# Patient Record
Sex: Female | Born: 1960 | Race: White | Hispanic: No | Marital: Married | Smoking: Never smoker
Health system: Southern US, Community
[De-identification: ages and names within clinical notes are randomized; demographics above are authoritative.]

---

## 1998-09-09 ENCOUNTER — Inpatient Hospital Stay (HOSPITAL_COMMUNITY): Admission: EM | Admit: 1998-09-09 | Discharge: 1998-09-09 | Payer: Self-pay | Admitting: Obstetrics and Gynecology

## 1998-12-25 ENCOUNTER — Ambulatory Visit (HOSPITAL_COMMUNITY): Admission: RE | Admit: 1998-12-25 | Discharge: 1998-12-25 | Payer: Self-pay | Admitting: Urology

## 2001-12-05 ENCOUNTER — Other Ambulatory Visit: Admission: RE | Admit: 2001-12-05 | Discharge: 2001-12-05 | Payer: Self-pay | Admitting: Obstetrics and Gynecology

## 2002-12-11 ENCOUNTER — Ambulatory Visit (HOSPITAL_COMMUNITY): Admission: RE | Admit: 2002-12-11 | Discharge: 2002-12-11 | Payer: Self-pay | Admitting: Unknown Physician Specialty

## 2005-09-10 ENCOUNTER — Ambulatory Visit (HOSPITAL_COMMUNITY): Admission: RE | Admit: 2005-09-10 | Discharge: 2005-09-10 | Payer: Self-pay | Admitting: Neurosurgery

## 2008-08-02 ENCOUNTER — Emergency Department (HOSPITAL_COMMUNITY): Admission: EM | Admit: 2008-08-02 | Discharge: 2008-08-03 | Payer: Self-pay | Admitting: Emergency Medicine

## 2009-04-22 ENCOUNTER — Other Ambulatory Visit: Admission: RE | Admit: 2009-04-22 | Discharge: 2009-04-22 | Payer: Self-pay | Admitting: Family Medicine

## 2010-04-13 ENCOUNTER — Other Ambulatory Visit: Payer: Self-pay | Admitting: Family Medicine

## 2010-04-13 ENCOUNTER — Other Ambulatory Visit
Admission: RE | Admit: 2010-04-13 | Discharge: 2010-04-13 | Payer: Self-pay | Source: Home / Self Care | Admitting: Family Medicine

## 2010-08-07 NOTE — Op Note (Signed)
NAMENDIDI, NESBY          ACCOUNT NO.:  [id number]   MEDICAL RECORD NO.:  [id number]          PATIENT TYPE:  OIB   LOCATION:  3172                         FACILITY:  [facility]   PHYSICIAN:  Donalee Citrin, M.D.        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   PROCEDURE:  Extraforaminal approach to lumbar diskectomy L4-5 left with  microscopic dissection of left L4 nerve root.   SURGEON:  Donalee Citrin, M.D.   ASSISTANT:  Reinaldo Meeker, M.D.   ANESTHESIA:  General endotracheal.   HPI:  The patient is a very pleasant 50 year old female who has had  longstanding back and predominantly left hip and leg pain radiating down to  her anterior shin consistent with an L4 nerve root pattern.  Preoperative  imaging showed a prefemoral to femoral disk herniation causing compression  to the left L4 nerve root.  The patient failed all forms of conservative  treatment with epidural steroid injections, physical therapy.  Risks and  benefits were explained to the patient, she understood and agreed to  proceed.   PROCEDURE:  The patient was brought to the OR, was induced under general  anesthesia, positioned prone on a Wilson frame.  The fascia was then prepped  and draped sterilely, localized the L4-5 disk space and a midline incision  was made just at this level and slightly superior.  Lidocaine 10 mL with  epinephrine and Bovie electrocautery was used to take down the subcutaneous  tissues.  Subperiosteal dissection carried out to the lamina of L4 and L5.  The L4 TP was dissected out and marked, confirmed by intraoperative x-ray.  Then the inferior aspect of the L4 TP and facet complex, lateral pars and  superior aspect of the facet complex of L4-5 was drilled using a 4 Penfield.  The intertransverse  ligament was dissected off of the undersurface of the  pars and superior aspect facet complex.  Then using the small bur on the  high speed drill some additional lateral pars and superior facet was drilled  down.  Then using a 2 mm Kerrison punch the undersurface was underbitten,  exposing further the intertransverse ligament.  At this point, the operating  microscope was draped and brought into the field.  Under microscopic  illumination, the intertransverse ligament was teased away with a blunt  nerve hook and the L4 nerve root was immediately visualized, and then using  a 2 and 3 mm Kerrison punch this ligament was removed further, exposing the  L4 nerve root distally.  Then the disk space was identified, the upper and  lower veins coagulated over the disk space.  A nerve hook was passed  underneath the L4 nerve root and disk space noted to be markedly humped up  and a piece was appreciated medial to the exposure since some additional  lateral pars was drilled down and underbitten to gain further exposure  medially underneath  the facet complex.  Then __________ was used to make an  amniotomy and then using from underneath the annulus with a blunt nerve hook  working medially several large fragments were removed from underneath the  facet complex which was tenting up against the proximal L4 nerve root.  Then, at this point, the lateral aspect of the dura of the thecal sac was  visualized and working underneath there some additional fragments were  expressed, both from underneath the thecal sac as well as underneath the L4  nerve root.  This disk space was adequately cleaned out and at the end of  the diskectomy there was no further stenosis on the L4, on the L4 nerve root  as well as the lateral thecal sac.  The wound was then copiously irrigated.  Meticulous hemostasis was maintained.  Gelfoam was elevated on top of the  dura and the extrafemoral space of the L4 nerve root.  The wound   was closed in layers with interrupted Vicryl and the skin was closed with  running #4-0 subcuticular.  Benzoin and Steri-Strips applied.  The patient  taken to the recovery room in stable condition.  At the end of the case  needle, sponge and instrument counts correct. __________.           ______________________________  Donalee Citrin, M.D.     GC/MEDQ  D:  09/10/2005  T:  09/10/2005  Job:  [id number]

## 2010-08-14 ENCOUNTER — Other Ambulatory Visit: Payer: Self-pay | Admitting: Gastroenterology

## 2010-11-02 ENCOUNTER — Ambulatory Visit
Admission: RE | Admit: 2010-11-02 | Discharge: 2010-11-02 | Disposition: A | Discharge status: Home / Self Care | Payer: 59 | Source: Ambulatory Visit | Attending: Family Medicine | Admitting: Family Medicine

## 2010-11-02 ENCOUNTER — Other Ambulatory Visit: Payer: Self-pay | Admitting: Family Medicine

## 2010-11-02 DIAGNOSIS — R0781 Pleurodynia: Secondary | ICD-10-CM

## 2012-06-19 ENCOUNTER — Other Ambulatory Visit: Payer: Self-pay | Admitting: Allergy

## 2012-06-19 ENCOUNTER — Ambulatory Visit
Admission: RE | Admit: 2012-06-19 | Discharge: 2012-06-19 | Disposition: A | Discharge status: Home / Self Care | Payer: 59 | Source: Ambulatory Visit | Attending: Allergy | Admitting: Allergy

## 2012-06-19 DIAGNOSIS — R059 Cough, unspecified: Secondary | ICD-10-CM

## 2012-06-19 DIAGNOSIS — R05 Cough: Secondary | ICD-10-CM

## 2013-06-13 ENCOUNTER — Other Ambulatory Visit: Payer: Self-pay | Admitting: Family Medicine

## 2013-06-13 ENCOUNTER — Other Ambulatory Visit (HOSPITAL_COMMUNITY)
Admission: RE | Admit: 2013-06-13 | Discharge: 2013-06-13 | Disposition: A | Discharge status: Home / Self Care | Payer: 59 | Source: Ambulatory Visit | Attending: Family Medicine | Admitting: Family Medicine

## 2013-06-13 DIAGNOSIS — Z1151 Encounter for screening for human papillomavirus (HPV): Secondary | ICD-10-CM | POA: Insufficient documentation

## 2013-06-13 DIAGNOSIS — IMO0001 Reserved for inherently not codable concepts without codable children: Secondary | ICD-10-CM

## 2013-06-13 DIAGNOSIS — Z124 Encounter for screening for malignant neoplasm of cervix: Secondary | ICD-10-CM | POA: Insufficient documentation

## 2013-06-13 DIAGNOSIS — R1110 Vomiting, unspecified: Principal | ICD-10-CM

## 2013-06-22 ENCOUNTER — Ambulatory Visit
Admission: RE | Admit: 2013-06-22 | Discharge: 2013-06-22 | Disposition: A | Discharge status: Home / Self Care | Payer: 59 | Source: Ambulatory Visit | Attending: Family Medicine | Admitting: Family Medicine

## 2013-06-22 DIAGNOSIS — IMO0001 Reserved for inherently not codable concepts without codable children: Secondary | ICD-10-CM

## 2013-06-22 DIAGNOSIS — R1110 Vomiting, unspecified: Principal | ICD-10-CM

## 2013-07-30 ENCOUNTER — Ambulatory Visit (HOSPITAL_BASED_OUTPATIENT_CLINIC_OR_DEPARTMENT_OTHER): Payer: 59 | Attending: Family Medicine | Admitting: Radiology

## 2013-07-30 VITALS — Ht 65.0 in | Wt 190.0 lb

## 2013-07-30 DIAGNOSIS — R0683 Snoring: Secondary | ICD-10-CM

## 2013-07-30 DIAGNOSIS — R0989 Other specified symptoms and signs involving the circulatory and respiratory systems: Principal | ICD-10-CM | POA: Insufficient documentation

## 2013-07-30 DIAGNOSIS — R0681 Apnea, not elsewhere classified: Secondary | ICD-10-CM

## 2013-07-30 DIAGNOSIS — R0609 Other forms of dyspnea: Secondary | ICD-10-CM | POA: Insufficient documentation

## 2013-07-30 DIAGNOSIS — R5381 Other malaise: Secondary | ICD-10-CM | POA: Insufficient documentation

## 2013-07-30 DIAGNOSIS — R5383 Other fatigue: Secondary | ICD-10-CM

## 2013-08-04 DIAGNOSIS — R0989 Other specified symptoms and signs involving the circulatory and respiratory systems: Secondary | ICD-10-CM

## 2013-08-04 DIAGNOSIS — G4730 Sleep apnea, unspecified: Secondary | ICD-10-CM

## 2013-08-04 DIAGNOSIS — G4710 Hypersomnia, unspecified: Secondary | ICD-10-CM

## 2013-08-04 DIAGNOSIS — R5383 Other fatigue: Secondary | ICD-10-CM

## 2013-08-04 DIAGNOSIS — R0609 Other forms of dyspnea: Secondary | ICD-10-CM

## 2013-08-04 DIAGNOSIS — R5381 Other malaise: Secondary | ICD-10-CM

## 2013-08-04 NOTE — Sleep Study (Signed)
   NAME: Marisa Rice DATE OF BIRTH:  1960/12/02 MEDICAL RECORD NUMBER [id number]  LOCATION: [facility]  PHYSICIAN: Elverda Wendel D [place]  DATE OF STUDY: 07/30/2013  SLEEP STUDY TYPE: Nocturnal Polysomnogram               REFERRING PHYSICIAN: Koirala, Dibas, MD  INDICATION FOR STUDY:  hypersomnia with sleep apnea  EPWORTH SLEEPINESS SCORE:   15/24 HEIGHT: 5\' 5"  (165.1 cm)  WEIGHT: 190 lb (86.183 kg)    Body mass index is 31.62 kg/(m^2).  NECK SIZE: 14 in.  MEDICATIONS: Charted for review  SLEEP ARCHITECTURE: Total sleep time 319 minutes with sleep efficiency 81%. Stage I was 14.6%, stage II 70.8%, stage III 6.1%, REM 8.5% of total sleep time. Sleep latency 33 minutes, REM latency 301 minutes, awake after sleep onset 43.5 minutes, arousal index 23.9. Bedtime medication: Terbinafine, montelukast, duloxetine, Requip, bupropion  RESPIRATORY DATA: Apnea hypopnea index (AHI) 0.8 per hour. 4 total events scored including 3 obstructive apneas and one hypopneas. All events were associated with nonsupine sleep position. REM AHI 0. There were not enough events to qualify for split protocol CPAP titration.  OXYGEN DATA: Mild to moderate snoring with oxygen desaturation to a nadir of 91% and mean oxygen saturation through the study of 95% on room air.  CARDIAC DATA: Normal sinus rhythm  MOVEMENT/PARASOMNIA: A few incidental limb jerks were noted with little effect on sleep. Bathroom x1.  IMPRESSION/ RECOMMENDATION:   1) Sleep was marked by frequent arousals and spontaneous awakening unrelated to respiratory events or limb movement. This may indicate an insomnia pattern if typical of the home environment. 2) Occasional respiratory events with sleep disturbance, within normal limits. AHI 0.8 per hour (the normal range for adults is an AHI from 0-5 events per hour). Mild to moderate snoring with oxygen desaturation to a nadir of 91% and mean oxygen saturation through the  study of 95% on room air.   Signed Jetty Duhamellinton Alvis Pulcini M.D. Waymon Budgelinton D Cela Newcom Diplomate, Biomedical engineerAmerican Board of Sleep Medicine  ELECTRONICALLY SIGNED ON:  08/04/2013, 10:17 AM [facility] PH: [phone]   FX: [phone] ACCREDITED BY THE AMERICAN ACADEMY OF SLEEP MEDICINE

## 2014-02-04 ENCOUNTER — Other Ambulatory Visit: Payer: Self-pay | Admitting: Orthopaedic Surgery

## 2014-02-04 DIAGNOSIS — M545 Low back pain: Secondary | ICD-10-CM

## 2014-02-18 ENCOUNTER — Ambulatory Visit
Admission: RE | Admit: 2014-02-18 | Discharge: 2014-02-18 | Disposition: A | Discharge status: Home / Self Care | Payer: 59 | Source: Ambulatory Visit | Attending: Orthopaedic Surgery | Admitting: Orthopaedic Surgery

## 2014-02-18 DIAGNOSIS — M545 Low back pain: Secondary | ICD-10-CM

## 2015-09-10 ENCOUNTER — Other Ambulatory Visit: Payer: Self-pay | Admitting: Family Medicine

## 2015-09-10 DIAGNOSIS — N644 Mastodynia: Secondary | ICD-10-CM

## 2015-09-16 ENCOUNTER — Other Ambulatory Visit: Payer: Self-pay | Admitting: Family Medicine

## 2015-09-16 ENCOUNTER — Ambulatory Visit
Admission: RE | Admit: 2015-09-16 | Discharge: 2015-09-16 | Disposition: A | Discharge status: Home / Self Care | Payer: 59 | Source: Ambulatory Visit | Attending: Family Medicine | Admitting: Family Medicine

## 2015-09-16 DIAGNOSIS — N644 Mastodynia: Secondary | ICD-10-CM

## 2015-09-24 ENCOUNTER — Ambulatory Visit (INDEPENDENT_AMBULATORY_CARE_PROVIDER_SITE_OTHER): Payer: 59 | Admitting: Sports Medicine

## 2015-09-24 ENCOUNTER — Encounter: Payer: Self-pay | Admitting: Sports Medicine

## 2015-09-24 VITALS — BP 126/70 | HR 66 | Ht 65.0 in | Wt 179.0 lb

## 2015-09-24 DIAGNOSIS — M19079 Primary osteoarthritis, unspecified ankle and foot: Secondary | ICD-10-CM | POA: Insufficient documentation

## 2015-09-24 DIAGNOSIS — M1990 Unspecified osteoarthritis, unspecified site: Secondary | ICD-10-CM

## 2015-09-24 DIAGNOSIS — M79674 Pain in right toe(s): Secondary | ICD-10-CM | POA: Insufficient documentation

## 2015-09-24 MED ORDER — DICLOFENAC SODIUM 1 % TD GEL: 2.0000 g | Freq: Four times a day (QID) | TRANSDERMAL | Status: AC

## 2015-09-24 NOTE — Progress Notes (Signed)
   [facility] Marisa Pociask, MD Phone: [phone]  Reason For Visit:   # Chronic pain in right toe for six or seven years. Patient was seen at [facility] about 5 years ago. 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 Patient received a metal foot plate to put in shoe. Patient states the pain improved. However over the last year pain has been constantly present. Worsened at the beginning of June when pain was exacerbated bouncing in a bounce house and rafting following day.   Patient felt as if she might have reinjuried her foot. Patient toe was swollen for a week following, patient iced her right toe which helped. Patient states the pain is throbbing in nature. When patient is active, pain worsens. Pain is worse with bending movement. Worse when patient is bare-footed. Takes Advil as needed, does not see any effect on her foot. Has used voltaren gel which helps. No new changes in foot wear. Patient denies pain ever waking patient up in the middle of the night with pain.   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  Has increased doppler flow.  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  - Will provide orthotics at next visit within the month  - Voltaren gel for pain  -  Provided exercises for back pain, as this likely exacerbating toe pain due patient favoring right side

## 2015-09-24 NOTE — Assessment & Plan Note (Signed)
She may need to use Voltaren gel long term if it relieves her pain  She will need more supportive shoes as well as a custom orthotic  I don't think this require surgical intervention  She does have hallux rigidus and will benefit from a first ray posting

## 2015-09-24 NOTE — Assessment & Plan Note (Addendum)
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  - Will provide orthotics at next visit within the month  - Voltaren gel for pain  - Provided exercises for back pain, as this likely exacerbating toe pain due patient favoring right side

## 2015-09-24 NOTE — Patient Instructions (Signed)
We want you to come back with the month to obtain orthotics for your right foot. Continue using the voltaren gels as needed for pain. [place] tape first and second toe together for greater support. Please start doing exercises as discussed for back to help remove some of the strain on right leg.

## 2017-03-07 ENCOUNTER — Other Ambulatory Visit: Payer: Self-pay | Admitting: Family Medicine

## 2017-03-07 DIAGNOSIS — Z1239 Encounter for other screening for malignant neoplasm of breast: Secondary | ICD-10-CM

## 2017-04-01 ENCOUNTER — Ambulatory Visit: Payer: 59

## 2017-05-09 ENCOUNTER — Ambulatory Visit: Payer: Self-pay

## 2017-05-16 ENCOUNTER — Ambulatory Visit
Admission: RE | Admit: 2017-05-16 | Discharge: 2017-05-16 | Disposition: A | Discharge status: Home / Self Care | Payer: Self-pay | Source: Ambulatory Visit | Attending: Family Medicine | Admitting: Family Medicine

## 2017-05-16 DIAGNOSIS — Z1239 Encounter for other screening for malignant neoplasm of breast: Secondary | ICD-10-CM

## 2017-12-27 DIAGNOSIS — F331 Major depressive disorder, recurrent, moderate: Secondary | ICD-10-CM | POA: Diagnosis not present

## 2018-01-03 DIAGNOSIS — F331 Major depressive disorder, recurrent, moderate: Secondary | ICD-10-CM | POA: Diagnosis not present

## 2018-01-24 DIAGNOSIS — F331 Major depressive disorder, recurrent, moderate: Secondary | ICD-10-CM | POA: Diagnosis not present

## 2018-01-31 DIAGNOSIS — F331 Major depressive disorder, recurrent, moderate: Secondary | ICD-10-CM | POA: Diagnosis not present

## 2018-02-07 DIAGNOSIS — F331 Major depressive disorder, recurrent, moderate: Secondary | ICD-10-CM | POA: Diagnosis not present

## 2018-02-14 DIAGNOSIS — F331 Major depressive disorder, recurrent, moderate: Secondary | ICD-10-CM | POA: Diagnosis not present

## 2018-02-21 DIAGNOSIS — F331 Major depressive disorder, recurrent, moderate: Secondary | ICD-10-CM | POA: Diagnosis not present

## 2018-02-28 DIAGNOSIS — F331 Major depressive disorder, recurrent, moderate: Secondary | ICD-10-CM | POA: Diagnosis not present

## 2018-03-18 DIAGNOSIS — R5383 Other fatigue: Secondary | ICD-10-CM | POA: Diagnosis not present

## 2018-03-18 DIAGNOSIS — J029 Acute pharyngitis, unspecified: Secondary | ICD-10-CM | POA: Diagnosis not present

## 2018-03-18 DIAGNOSIS — J101 Influenza due to other identified influenza virus with other respiratory manifestations: Secondary | ICD-10-CM | POA: Diagnosis not present

## 2018-03-18 DIAGNOSIS — R05 Cough: Secondary | ICD-10-CM | POA: Diagnosis not present

## 2018-03-18 DIAGNOSIS — R5381 Other malaise: Secondary | ICD-10-CM | POA: Diagnosis not present

## 2018-03-20 DIAGNOSIS — J111 Influenza due to unidentified influenza virus with other respiratory manifestations: Secondary | ICD-10-CM | POA: Diagnosis not present

## 2018-04-06 DIAGNOSIS — F331 Major depressive disorder, recurrent, moderate: Secondary | ICD-10-CM | POA: Diagnosis not present

## 2018-04-19 DIAGNOSIS — F331 Major depressive disorder, recurrent, moderate: Secondary | ICD-10-CM | POA: Diagnosis not present

## 2018-04-30 DIAGNOSIS — R079 Chest pain, unspecified: Secondary | ICD-10-CM | POA: Diagnosis not present

## 2018-04-30 DIAGNOSIS — J209 Acute bronchitis, unspecified: Secondary | ICD-10-CM | POA: Diagnosis not present

## 2018-05-02 DIAGNOSIS — F331 Major depressive disorder, recurrent, moderate: Secondary | ICD-10-CM | POA: Diagnosis not present

## 2018-05-04 DIAGNOSIS — F331 Major depressive disorder, recurrent, moderate: Secondary | ICD-10-CM | POA: Diagnosis not present

## 2018-05-09 DIAGNOSIS — F331 Major depressive disorder, recurrent, moderate: Secondary | ICD-10-CM | POA: Diagnosis not present

## 2018-05-16 DIAGNOSIS — F331 Major depressive disorder, recurrent, moderate: Secondary | ICD-10-CM | POA: Diagnosis not present

## 2018-05-23 DIAGNOSIS — F331 Major depressive disorder, recurrent, moderate: Secondary | ICD-10-CM | POA: Diagnosis not present

## 2018-05-30 DIAGNOSIS — F331 Major depressive disorder, recurrent, moderate: Secondary | ICD-10-CM | POA: Diagnosis not present

## 2018-06-08 DIAGNOSIS — F331 Major depressive disorder, recurrent, moderate: Secondary | ICD-10-CM | POA: Diagnosis not present

## 2018-06-13 DIAGNOSIS — F331 Major depressive disorder, recurrent, moderate: Secondary | ICD-10-CM | POA: Diagnosis not present

## 2018-06-19 DIAGNOSIS — F331 Major depressive disorder, recurrent, moderate: Secondary | ICD-10-CM | POA: Diagnosis not present

## 2018-06-29 DIAGNOSIS — F331 Major depressive disorder, recurrent, moderate: Secondary | ICD-10-CM | POA: Diagnosis not present

## 2018-07-11 DIAGNOSIS — F331 Major depressive disorder, recurrent, moderate: Secondary | ICD-10-CM | POA: Diagnosis not present

## 2018-07-20 DIAGNOSIS — J45909 Unspecified asthma, uncomplicated: Secondary | ICD-10-CM | POA: Diagnosis not present

## 2018-07-20 DIAGNOSIS — J309 Allergic rhinitis, unspecified: Secondary | ICD-10-CM | POA: Diagnosis not present

## 2018-07-20 DIAGNOSIS — G2581 Restless legs syndrome: Secondary | ICD-10-CM | POA: Diagnosis not present

## 2018-07-20 DIAGNOSIS — F321 Major depressive disorder, single episode, moderate: Secondary | ICD-10-CM | POA: Diagnosis not present

## 2018-07-25 DIAGNOSIS — F331 Major depressive disorder, recurrent, moderate: Secondary | ICD-10-CM | POA: Diagnosis not present

## 2018-08-01 DIAGNOSIS — F331 Major depressive disorder, recurrent, moderate: Secondary | ICD-10-CM | POA: Diagnosis not present

## 2018-08-21 DIAGNOSIS — F331 Major depressive disorder, recurrent, moderate: Secondary | ICD-10-CM | POA: Diagnosis not present

## 2018-08-28 DIAGNOSIS — F331 Major depressive disorder, recurrent, moderate: Secondary | ICD-10-CM | POA: Diagnosis not present

## 2018-09-04 DIAGNOSIS — F331 Major depressive disorder, recurrent, moderate: Secondary | ICD-10-CM | POA: Diagnosis not present

## 2018-09-11 DIAGNOSIS — F331 Major depressive disorder, recurrent, moderate: Secondary | ICD-10-CM | POA: Diagnosis not present

## 2018-09-25 DIAGNOSIS — F331 Major depressive disorder, recurrent, moderate: Secondary | ICD-10-CM | POA: Diagnosis not present

## 2018-10-02 DIAGNOSIS — F331 Major depressive disorder, recurrent, moderate: Secondary | ICD-10-CM | POA: Diagnosis not present

## 2018-10-03 DIAGNOSIS — Z1159 Encounter for screening for other viral diseases: Secondary | ICD-10-CM | POA: Diagnosis not present

## 2018-10-16 DIAGNOSIS — F331 Major depressive disorder, recurrent, moderate: Secondary | ICD-10-CM | POA: Diagnosis not present

## 2018-10-26 DIAGNOSIS — F331 Major depressive disorder, recurrent, moderate: Secondary | ICD-10-CM | POA: Diagnosis not present

## 2018-10-30 DIAGNOSIS — F331 Major depressive disorder, recurrent, moderate: Secondary | ICD-10-CM | POA: Diagnosis not present

## 2018-11-06 DIAGNOSIS — F331 Major depressive disorder, recurrent, moderate: Secondary | ICD-10-CM | POA: Diagnosis not present

## 2018-11-20 DIAGNOSIS — F331 Major depressive disorder, recurrent, moderate: Secondary | ICD-10-CM | POA: Diagnosis not present

## 2018-11-28 DIAGNOSIS — F331 Major depressive disorder, recurrent, moderate: Secondary | ICD-10-CM | POA: Diagnosis not present

## 2018-11-29 DIAGNOSIS — H109 Unspecified conjunctivitis: Secondary | ICD-10-CM | POA: Diagnosis not present

## 2018-12-05 DIAGNOSIS — F331 Major depressive disorder, recurrent, moderate: Secondary | ICD-10-CM | POA: Diagnosis not present

## 2018-12-12 DIAGNOSIS — F331 Major depressive disorder, recurrent, moderate: Secondary | ICD-10-CM | POA: Diagnosis not present

## 2018-12-20 DIAGNOSIS — F331 Major depressive disorder, recurrent, moderate: Secondary | ICD-10-CM | POA: Diagnosis not present

## 2018-12-25 DIAGNOSIS — F331 Major depressive disorder, recurrent, moderate: Secondary | ICD-10-CM | POA: Diagnosis not present

## 2019-01-08 DIAGNOSIS — F331 Major depressive disorder, recurrent, moderate: Secondary | ICD-10-CM | POA: Diagnosis not present

## 2019-01-18 DIAGNOSIS — F331 Major depressive disorder, recurrent, moderate: Secondary | ICD-10-CM | POA: Diagnosis not present

## 2019-01-23 DIAGNOSIS — F331 Major depressive disorder, recurrent, moderate: Secondary | ICD-10-CM | POA: Diagnosis not present

## 2019-01-29 DIAGNOSIS — F331 Major depressive disorder, recurrent, moderate: Secondary | ICD-10-CM | POA: Diagnosis not present

## 2019-02-05 DIAGNOSIS — F331 Major depressive disorder, recurrent, moderate: Secondary | ICD-10-CM | POA: Diagnosis not present

## 2019-02-09 ENCOUNTER — Other Ambulatory Visit: Payer: Self-pay

## 2019-02-09 DIAGNOSIS — Z20822 Contact with and (suspected) exposure to covid-19: Secondary | ICD-10-CM

## 2019-02-12 DIAGNOSIS — F331 Major depressive disorder, recurrent, moderate: Secondary | ICD-10-CM | POA: Diagnosis not present

## 2019-02-12 LAB — NOVEL CORONAVIRUS, NAA: SARS-CoV-2, NAA: NOT DETECTED

## 2019-02-19 DIAGNOSIS — M898X8 Other specified disorders of bone, other site: Secondary | ICD-10-CM | POA: Diagnosis not present

## 2019-02-19 DIAGNOSIS — F331 Major depressive disorder, recurrent, moderate: Secondary | ICD-10-CM | POA: Diagnosis not present

## 2019-02-19 DIAGNOSIS — R519 Headache, unspecified: Secondary | ICD-10-CM | POA: Diagnosis not present

## 2019-02-23 ENCOUNTER — Other Ambulatory Visit: Payer: Self-pay | Admitting: Family Medicine

## 2019-02-23 DIAGNOSIS — R519 Headache, unspecified: Secondary | ICD-10-CM

## 2019-02-23 DIAGNOSIS — M898X8 Other specified disorders of bone, other site: Secondary | ICD-10-CM

## 2019-02-26 DIAGNOSIS — F331 Major depressive disorder, recurrent, moderate: Secondary | ICD-10-CM | POA: Diagnosis not present

## 2019-03-01 ENCOUNTER — Other Ambulatory Visit: Payer: Self-pay

## 2019-03-01 DIAGNOSIS — H6691 Otitis media, unspecified, right ear: Secondary | ICD-10-CM | POA: Diagnosis not present

## 2019-03-01 DIAGNOSIS — R0982 Postnasal drip: Secondary | ICD-10-CM | POA: Diagnosis not present

## 2019-03-01 DIAGNOSIS — Z20828 Contact with and (suspected) exposure to other viral communicable diseases: Secondary | ICD-10-CM | POA: Diagnosis not present

## 2019-03-05 ENCOUNTER — Other Ambulatory Visit: Payer: Self-pay

## 2019-03-07 DIAGNOSIS — F331 Major depressive disorder, recurrent, moderate: Secondary | ICD-10-CM | POA: Diagnosis not present

## 2019-03-12 ENCOUNTER — Other Ambulatory Visit: Payer: Self-pay | Admitting: Family Medicine

## 2019-03-12 DIAGNOSIS — R519 Headache, unspecified: Secondary | ICD-10-CM

## 2019-03-12 DIAGNOSIS — M898X8 Other specified disorders of bone, other site: Secondary | ICD-10-CM

## 2019-03-12 DIAGNOSIS — F331 Major depressive disorder, recurrent, moderate: Secondary | ICD-10-CM | POA: Diagnosis not present

## 2019-03-13 ENCOUNTER — Ambulatory Visit
Admission: RE | Admit: 2019-03-13 | Discharge: 2019-03-13 | Disposition: A | Discharge status: Home / Self Care | Payer: BC Managed Care – PPO | Source: Ambulatory Visit | Attending: Family Medicine | Admitting: Family Medicine

## 2019-03-13 DIAGNOSIS — R220 Localized swelling, mass and lump, head: Secondary | ICD-10-CM | POA: Diagnosis not present

## 2019-03-13 DIAGNOSIS — R519 Headache, unspecified: Secondary | ICD-10-CM

## 2019-03-13 DIAGNOSIS — M898X8 Other specified disorders of bone, other site: Secondary | ICD-10-CM

## 2021-01-23 IMAGING — CT CT HEAD W/O CM
4 series · 17 of 47 positions shown, 19 images · non-contrast
Comparison: None.

CLINICAL DATA: Forehead mass

EXAM:
CT HEAD WITHOUT CONTRAST
TECHNIQUE: Contiguous axial images were obtained from the base of the skull
through the vertex without intravenous contrast.

[Series 2: head 5.00 hr40 s3 axial ibhc · axial · 0.42mm/px · z∈[-587,-467]mm · 7 of 33 slices shown, 9 images]
[im 5/33  brain]
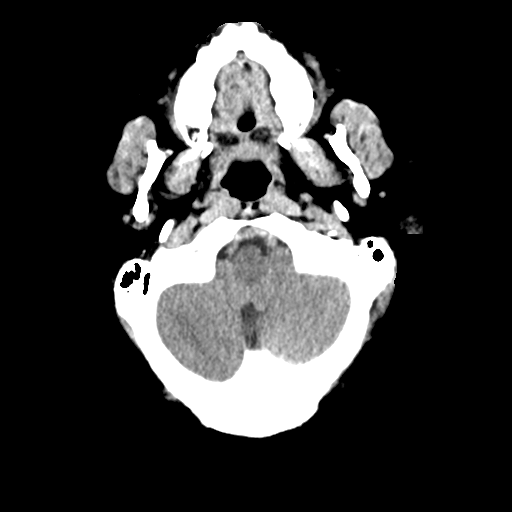
[im 5/33  bone]
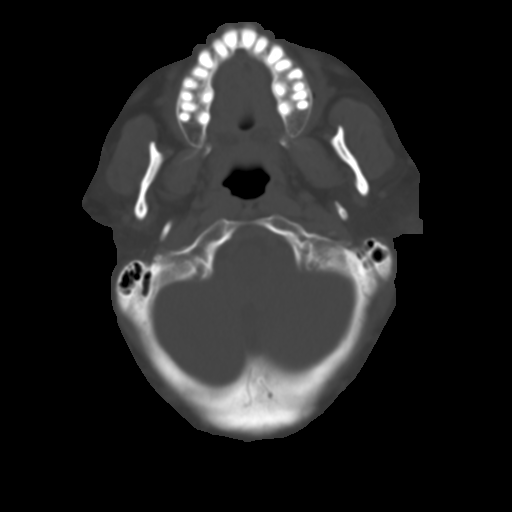
[im 9/33  brain]
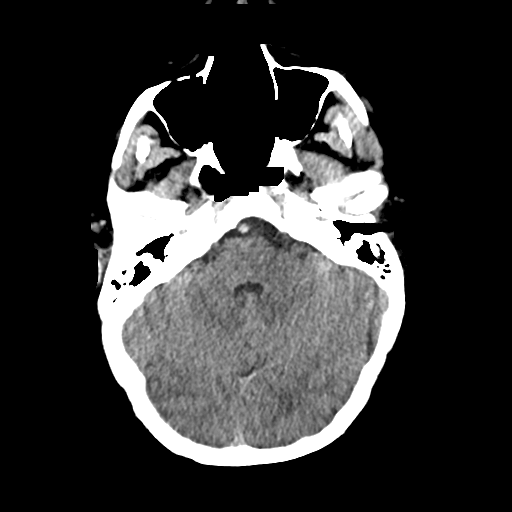
[im 13/33  brain]
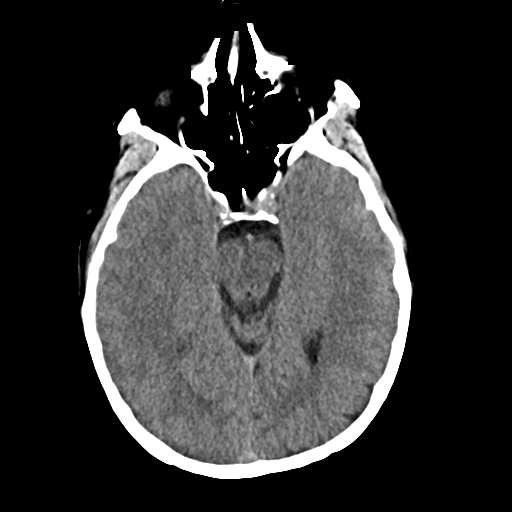
[im 17/33  brain]
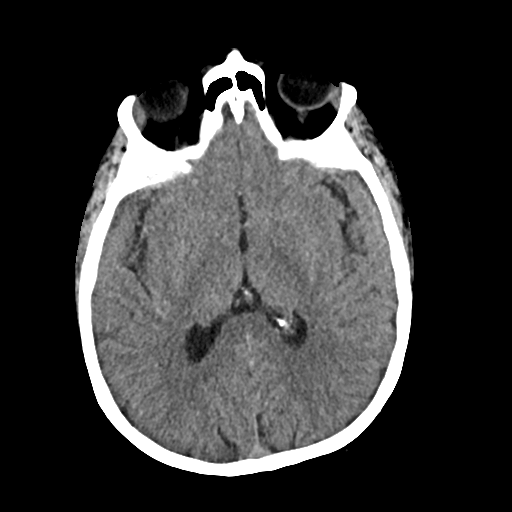
[im 21/33  brain]
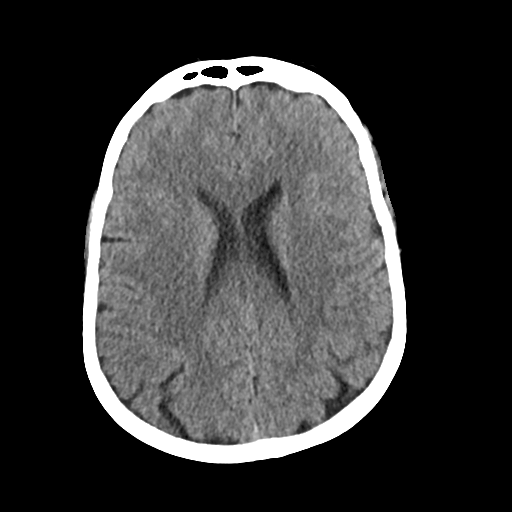
[im 21/33  bone]
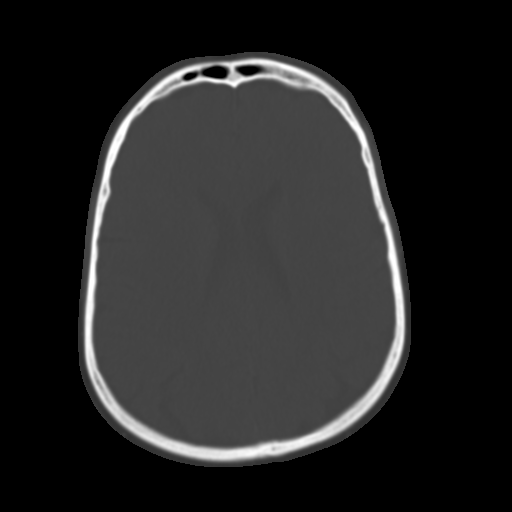
[im 25/33  brain]
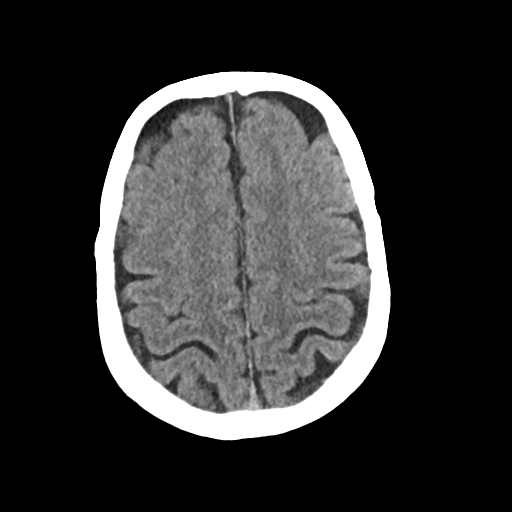
[im 29/33  brain]
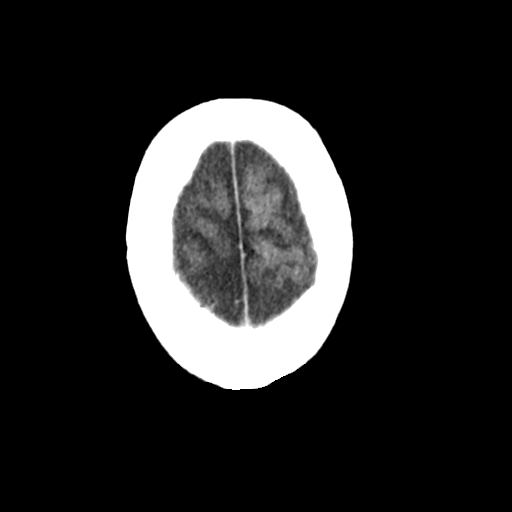

[Series 3: head 2.00 hr60 s3 axial bone · axial · 0.42mm/px · z∈[-592,-536]mm · 4 of 83 slices shown]
[im 9/83  bone]
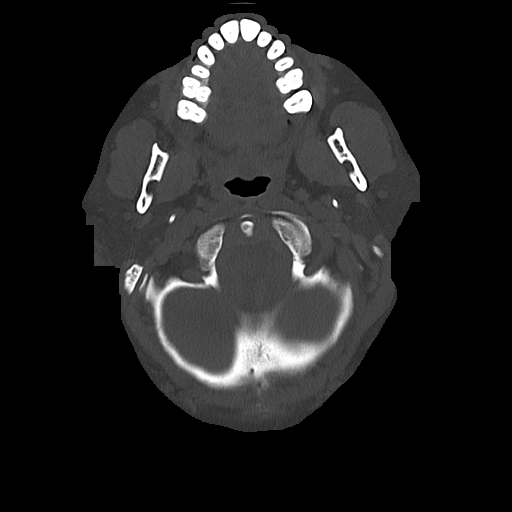
[im 17/83  bone]
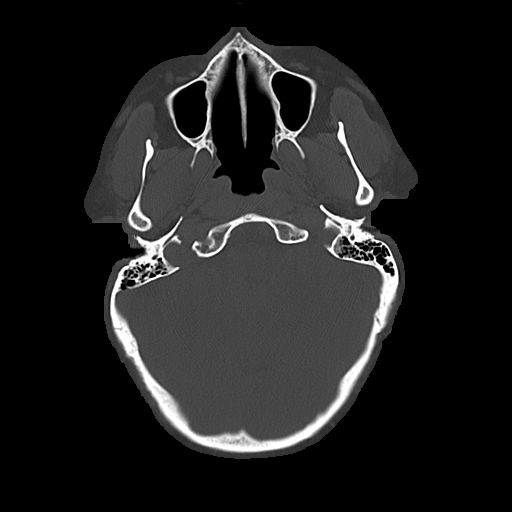
[im 25/83  bone]
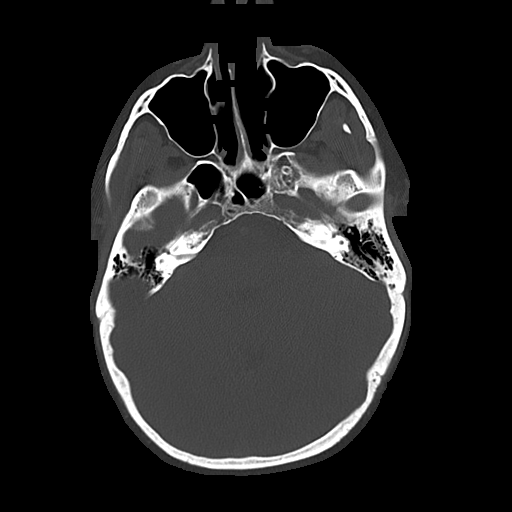
[im 37/83  bone]
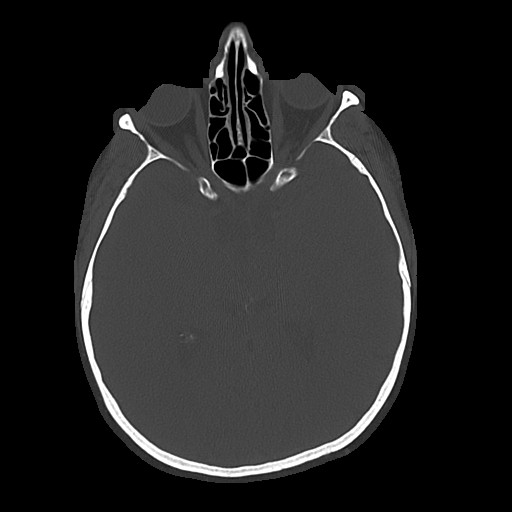

[Series 4: head 3.00 hr40 s3 sag · sagittal · 0.33mm/px · 3 of 84 slices shown]
[im 28/84  brain]
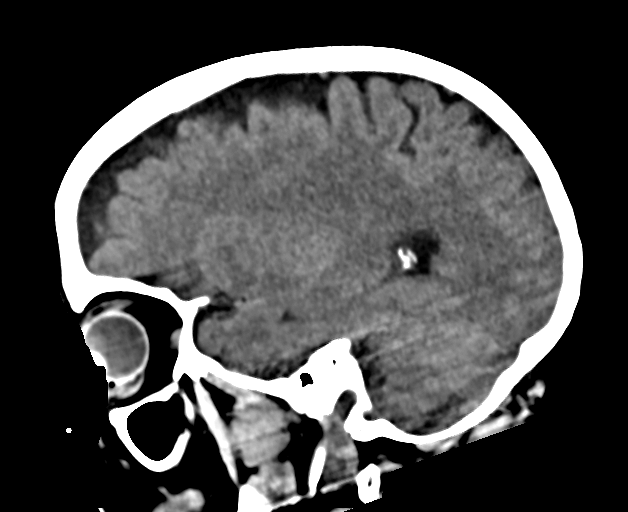
[im 42/84  brain]
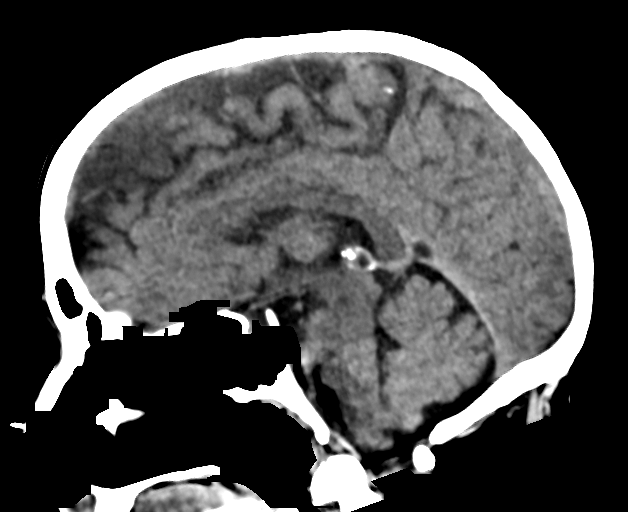
[im 56/84  brain]
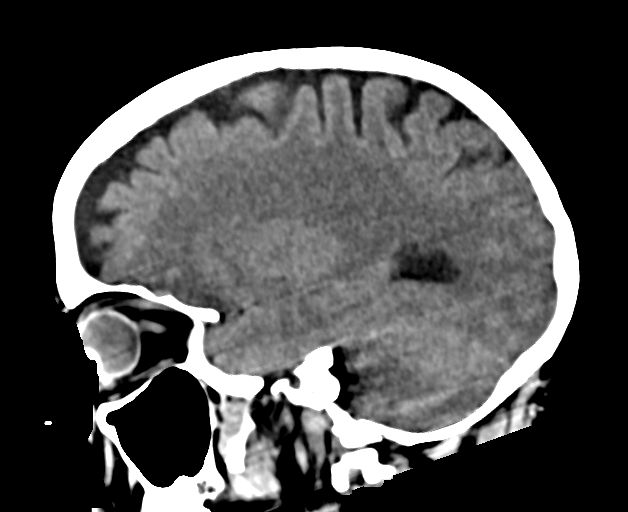

[Series 6: head 3.00 hr40 s3 cor · coronal · 0.33mm/px · 3 of 103 slices shown]
[im 35/103  brain]
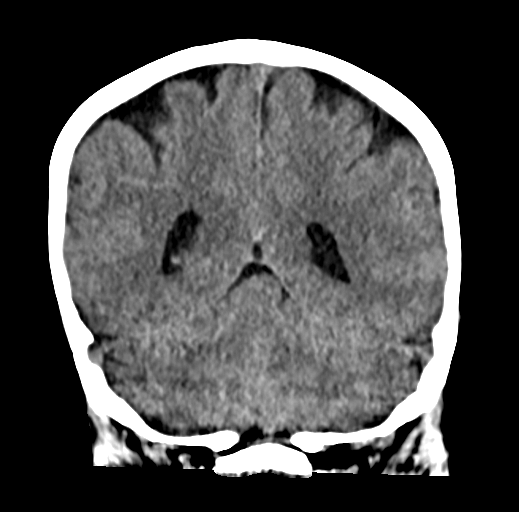
[im 46/103  brain]
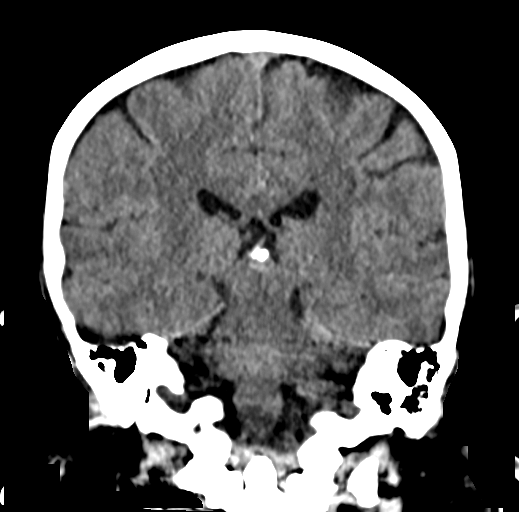
[im 57/103  brain]
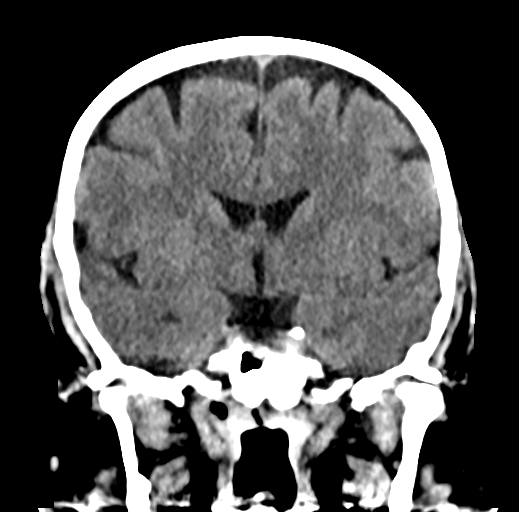

[17 of 47 positions shown; findings below may reference images not displayed]

FINDINGS: Brain: No evidence of acute infarction, hemorrhage, hydrocephalus,
extra-axial collection or mass lesion/mass effect.

Vascular: No hyperdense vessel or unexpected calcification.

Skull: Normal. Negative for fracture or focal lesion.

Sinuses/Orbits: No acute finding.

Other: 12 mm extra cutaneous mass along the right forehead. Contains
a fatty center hounsfieldunits -113. Well-circumscribed
IMPRESSION: Negative CT of the brain.

Likely extra cutaneous lipoma right forehead.
